# Patient Record
Sex: Female | Born: 1960 | Race: White | Hispanic: No | Marital: Married | Smoking: Current every day smoker
Health system: Southern US, Community
[De-identification: ages and names within clinical notes are randomized; demographics above are authoritative.]

## PROBLEM LIST (undated history)

## (undated) HISTORY — PX: APPENDECTOMY: SHX54

## (undated) HISTORY — PX: VAGINAL HYSTERECTOMY: SUR661

---

## 2007-11-22 ENCOUNTER — Encounter (INDEPENDENT_AMBULATORY_CARE_PROVIDER_SITE_OTHER): Payer: Self-pay | Admitting: Diagnostic Radiology

## 2007-11-22 ENCOUNTER — Encounter: Admission: RE | Admit: 2007-11-22 | Discharge: 2007-11-22 | Payer: Self-pay | Admitting: Internal Medicine

## 2007-12-01 ENCOUNTER — Encounter: Admission: RE | Admit: 2007-12-01 | Discharge: 2007-12-01 | Payer: Self-pay | Admitting: General Surgery

## 2007-12-05 ENCOUNTER — Ambulatory Visit: Payer: Self-pay | Admitting: Oncology

## 2007-12-05 ENCOUNTER — Encounter: Admission: RE | Admit: 2007-12-05 | Discharge: 2007-12-05 | Payer: Self-pay | Admitting: General Surgery

## 2007-12-13 ENCOUNTER — Encounter: Admission: RE | Admit: 2007-12-13 | Discharge: 2007-12-13 | Payer: Self-pay | Admitting: General Surgery

## 2008-02-16 ENCOUNTER — Encounter: Admission: RE | Admit: 2008-02-16 | Discharge: 2008-02-16 | Payer: Self-pay | Admitting: General Surgery

## 2008-02-20 ENCOUNTER — Ambulatory Visit (HOSPITAL_BASED_OUTPATIENT_CLINIC_OR_DEPARTMENT_OTHER): Admission: RE | Admit: 2008-02-20 | Discharge: 2008-02-20 | Payer: Self-pay | Admitting: General Surgery

## 2008-02-20 ENCOUNTER — Encounter (INDEPENDENT_AMBULATORY_CARE_PROVIDER_SITE_OTHER): Payer: Self-pay | Admitting: General Surgery

## 2008-02-20 ENCOUNTER — Encounter: Admission: RE | Admit: 2008-02-20 | Discharge: 2008-02-20 | Payer: Self-pay | Admitting: General Surgery

## 2008-02-20 DIAGNOSIS — C50919 Malignant neoplasm of unspecified site of unspecified female breast: Secondary | ICD-10-CM

## 2008-02-20 HISTORY — DX: Malignant neoplasm of unspecified site of unspecified female breast: C50.919

## 2010-12-23 NOTE — Op Note (Signed)
Angela Fuller, Angela Fuller              ACCOUNT NO.:  [id number]   MEDICAL RECORD NO.:  [id number]          PATIENT TYPE:  AMB   LOCATION:  DSC                          FACILITY:  [facility]   PHYSICIAN:  Angelia Mould. Derrell Lolling, M.D.DATE OF BIRTH:  01-18-1961   DATE OF PROCEDURE:  02/20/2008  DATE OF DISCHARGE:                               OPERATIVE REPORT   PREOPERATIVE DIAGNOSIS:  Invasive ductal carcinoma right breast.   POSTOPERATIVE DIAGNOSIS:  Invasive ductal carcinoma right breast.   OPERATION PERFORMED:  1. Injection blue dye right breast.  2. Right partial mastectomy with needle localization and specimen      mammogram.  3. Right axillary sentinel lymph node mapping and biopsy.   SURGEON:  Angelia Mould. Derrell Lolling, MD   OPERATIVE INDICATIONS:  This is a 50 year old white female who has not  had any breast problems in the past.  Screening mammograms revealed an  abnormality and a diagnostic mammogram and a right breast ultrasound  were performed.  The imaging studies showed two lesions in the right  breast in the upper outer quadrant, an 8 mm and a 5-mm nodule.  Both of  these were biopsied and showed invasive mammary carcinoma which was  receptor positive.  MRI showed the two masses and also showed a small  nodule in the lower outer quadrant of the right breast.  She  subsequently underwent an MRI-guided biopsy of the lesion in the lower  outer quadrant which showed benign process.  She has had genetic  counseling and genetic testing and she was negative for BRCA1 and BRCA2.  She is interested in breast conservation.  She was brought to the  operating room electively.   OPERATIVE TECHNIQUE:  The patient underwent bracketed, double wire  needle localization of the two abnormalities in the upper outer quadrant  of the right breast by Dr. Cain Saupe this morning.  The wires went  very posteriorly and in fact went into the pectoralis muscle.  They did  localize the lesions quite nicely,  however.  The patient was then  brought to [facility].  Nuclear medicine technician  injected radionuclide in the right retroareolar area.  The patient was  taken to the operating room.  The periareolar area of the right breast  was prepped with alcohol.  The patient was identified as the correct  patient, correct procedure and correct site.  We injected 5 mL of blue  dye in the right retroareolar area, 2 mL of methylene blue mixed with 3  mL of saline.  The breast was massaged for 4 minutes.  We then prepped  and draped the right axilla, right chest wall, right breast, and lower  neck in the usual sterile fashion.  Intravenous antibiotics were given  prior to any invasive part of the procedure.   We reviewed the x-rays.  We observed the localizing wires entering the  superior aspect of the right breast and directed from medial to lateral.  We marked a curved incision in the upper outer quadrant overlying where  we felt the two small cancers were.  Marcaine 0.5% with  epinephrine was  used as a local infiltration anesthetic.  The curved incision was made  in the upper outer quadrant of the right breast.  We took the dissection  down into the breast tissue.  We brought the wires into the wound.  We  dissected all the way down to the pectoralis major muscle medially and  then dissected laterally.  We found that the wires went through the  breast tissue into the pectoralis major muscle.  We removed the specimen  and marked it with six separate colors of ink to mark all the margins.  A specimen mammogram was obtained and both Dr. Deboraha Sprang and I felt that  both of the metallic markers had been completely removed.  I was not  sure whether the tumors were close to the posterior margin so I took a  rim of the lateral aspect of the pectoralis major muscle and marked the  new margin with the ink marker.  I discussed this with Dr. Dierdre Searles in  pathology.  She felt that we had completely removed  the cancers and  probably had a negative margin in the primary specimen though she was  going to test the muscle margin as well.  This wound was irrigated with  saline.  Hemostasis was excellent.  We were able to mobilize the deeper  breast tissue somewhat and close the deeper layers with five interrupted  sutures of 3-0 Vicryl.  The skin was closed with a running subcuticular  suture of 4-0 Monocryl.   Using the NeoProbe, I identified the area of greatest radioactivity in  the right axilla.  A curved skin crease incision was made in the right  axilla generally close to the hairline.  Dissection was carried down  through the subcutaneous tissue.  We used the NeoProbe to direct our  dissection.  We incised the clavipectoral fascia.  We entered the axilla  and actually found two blue lymphatics, traced them down to a single  very hot and very blue sentinel node.  This was excised.  It had about  100 times background activity.  Dr. Dierdre Searles in pathology did imprint cytology  on the single sentinel node and she said it was negative for cancer  cells.  We did not find any other sentinel nodes visually or by  NeoProbe.  The right axillary wound was irrigated with saline.  Hemostasis was excellent and achieved with electrocautery.  The deeper  tissues were closed with interrupted sutures of 3-0 Vicryl and the skin  closed with a running subcuticular suture of 4-0 Monocryl.  Both  incisions were also covered with Dermabond and then a light gauze  bandage and an ice pack.  The patient tolerated the procedure well and  was taken to the recovery room in stable condition.  Estimated blood  loss was about 20 mL or less.  Complications none.  Sponge, needles and  instrument counts were correct.      Angelia Mould. Derrell Lolling, M.D.  Electronically Signed     HMI/MEDQ  D:  02/20/2008  T:  02/20/2008  Job:  [id number]   cc:   Marlow Baars, MD  Dellia Beckwith, M.D.

## 2011-05-07 LAB — COMPREHENSIVE METABOLIC PANEL
Alkaline Phosphatase: 47
BUN: 7
Calcium: 9.2
Glucose, Bld: 91
Total Protein: 7.1

## 2011-05-07 LAB — CBC
HCT: 43.8
Hemoglobin: 15
MCHC: 34.3
MCV: 84.3
RDW: 13.3

## 2011-05-07 LAB — DIFFERENTIAL
Basophils Relative: 0
Lymphs Abs: 2
Monocytes Relative: 6
Neutro Abs: 5.2
Neutrophils Relative %: 65

## 2011-05-07 LAB — URINALYSIS, ROUTINE W REFLEX MICROSCOPIC
Bilirubin Urine: NEGATIVE
Hgb urine dipstick: NEGATIVE
Ketones, ur: NEGATIVE
Nitrite: NEGATIVE
pH: 6

## 2011-05-07 LAB — LACTATE DEHYDROGENASE, ISOENZYMES
LDH 1: 26 % (ref 14–27)
LDH 2: 36 % (ref 29–42)
LDH Isoenzymes, Total: 138 U/L (ref 105–230)

## 2011-07-12 ENCOUNTER — Inpatient Hospital Stay: Payer: Self-pay | Admitting: Internal Medicine

## 2012-01-21 DIAGNOSIS — Z8659 Personal history of other mental and behavioral disorders: Secondary | ICD-10-CM

## 2012-01-21 DIAGNOSIS — F32A Depression, unspecified: Secondary | ICD-10-CM

## 2012-01-21 DIAGNOSIS — IMO0002 Reserved for concepts with insufficient information to code with codable children: Secondary | ICD-10-CM

## 2012-01-21 DIAGNOSIS — J45909 Unspecified asthma, uncomplicated: Secondary | ICD-10-CM

## 2012-01-21 DIAGNOSIS — J4520 Mild intermittent asthma, uncomplicated: Secondary | ICD-10-CM | POA: Insufficient documentation

## 2012-01-21 DIAGNOSIS — F419 Anxiety disorder, unspecified: Secondary | ICD-10-CM

## 2012-01-21 HISTORY — DX: Mild intermittent asthma, uncomplicated: J45.20

## 2012-01-21 HISTORY — DX: Personal history of other mental and behavioral disorders: Z86.59

## 2012-01-21 HISTORY — DX: Anxiety disorder, unspecified: F41.9

## 2012-01-21 HISTORY — DX: Reserved for concepts with insufficient information to code with codable children: IMO0002

## 2012-01-21 HISTORY — DX: Unspecified asthma, uncomplicated: J45.909

## 2012-01-21 HISTORY — DX: Depression, unspecified: F32.A

## 2012-05-09 DIAGNOSIS — M7061 Trochanteric bursitis, right hip: Secondary | ICD-10-CM

## 2012-05-09 HISTORY — DX: Trochanteric bursitis, right hip: M70.61

## 2014-10-29 ENCOUNTER — Ambulatory Visit: Payer: Self-pay | Admitting: Family Medicine

## 2015-06-20 DIAGNOSIS — J069 Acute upper respiratory infection, unspecified: Secondary | ICD-10-CM | POA: Insufficient documentation

## 2015-06-20 DIAGNOSIS — J40 Bronchitis, not specified as acute or chronic: Secondary | ICD-10-CM | POA: Insufficient documentation

## 2015-06-20 HISTORY — DX: Acute upper respiratory infection, unspecified: J06.9

## 2015-06-20 HISTORY — DX: Bronchitis, not specified as acute or chronic: J40

## 2015-06-22 IMAGING — CR DG LUMBAR SPINE 2-3V
1 series · 3 of 3 positions shown · non-contrast
Comparison: None

CLINICAL DATA: Lymphedema RIGHT hip, chronic RIGHT hip and lumbar
pain, RIGHT-side nerve damage, RIGHT leg numbness and weakness for 4
years, history breast cancer, ovarian cancer, bulging discs

EXAM:
LUMBAR SPINE - 2-3 VIEW

[Series 1: dxr lumbar spine ap and lateral · 0.14mm/px · 3 of 3 slices shown]
[im 1/3]
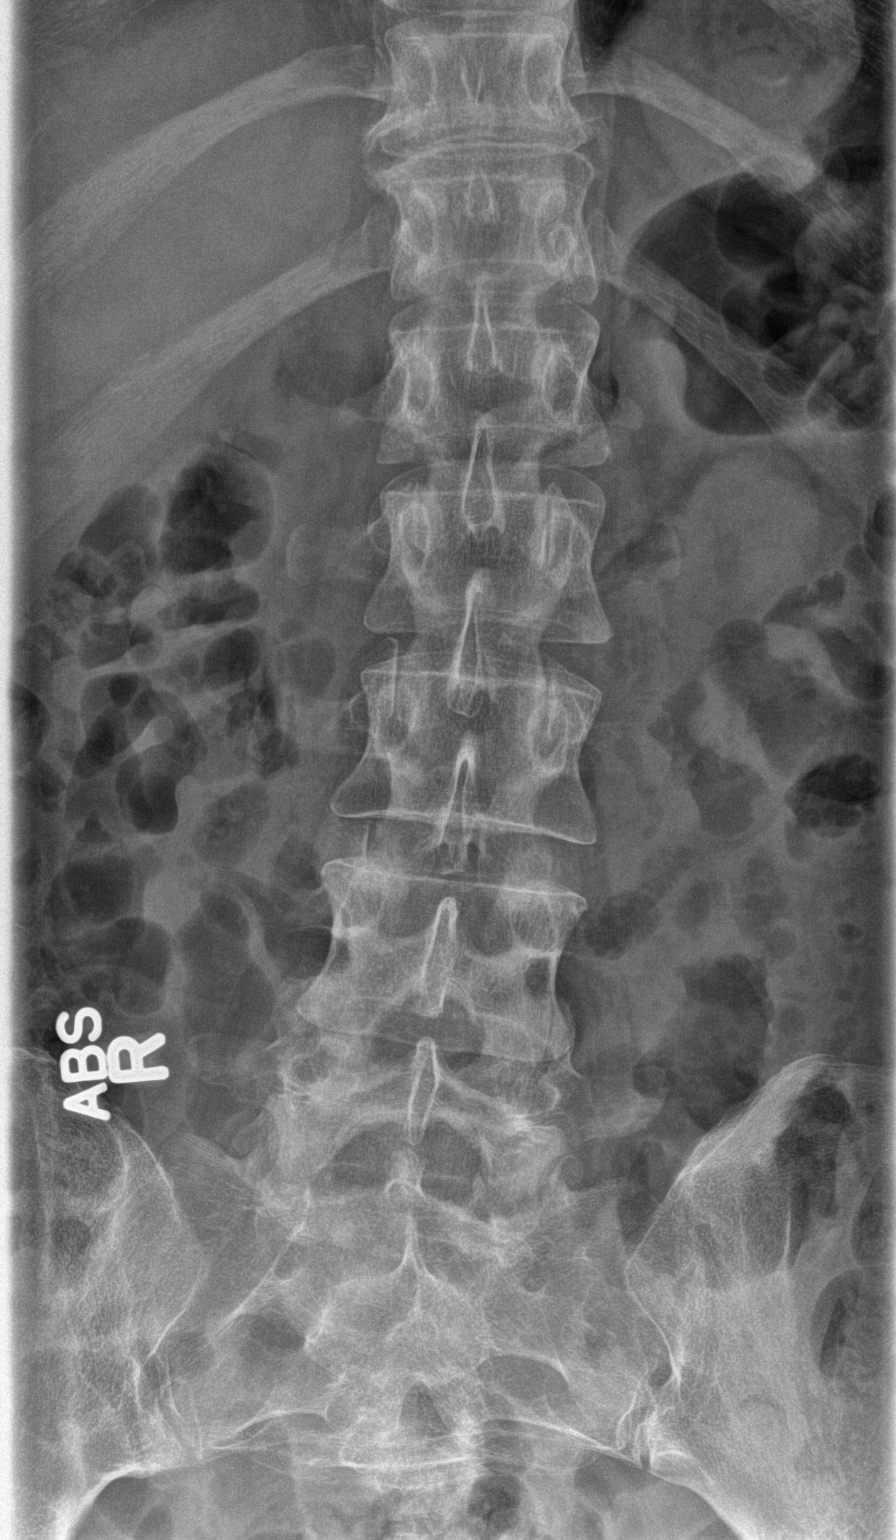
[im 2/3]
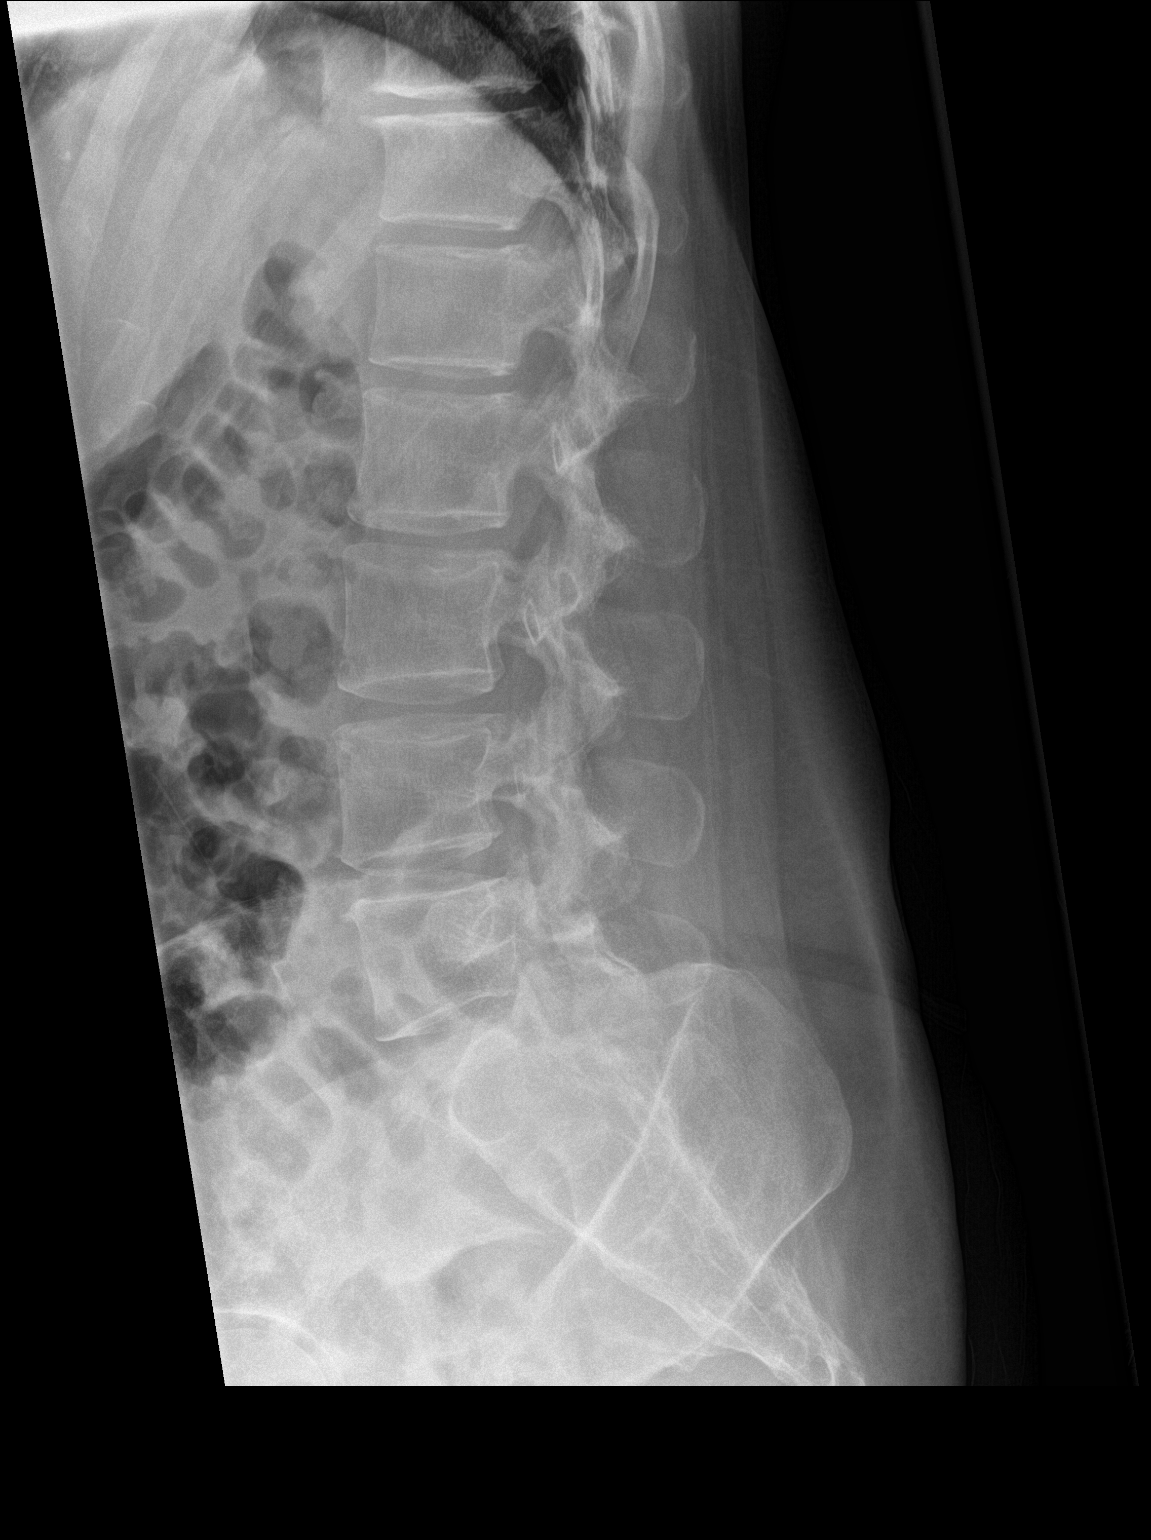
[im 3/3]
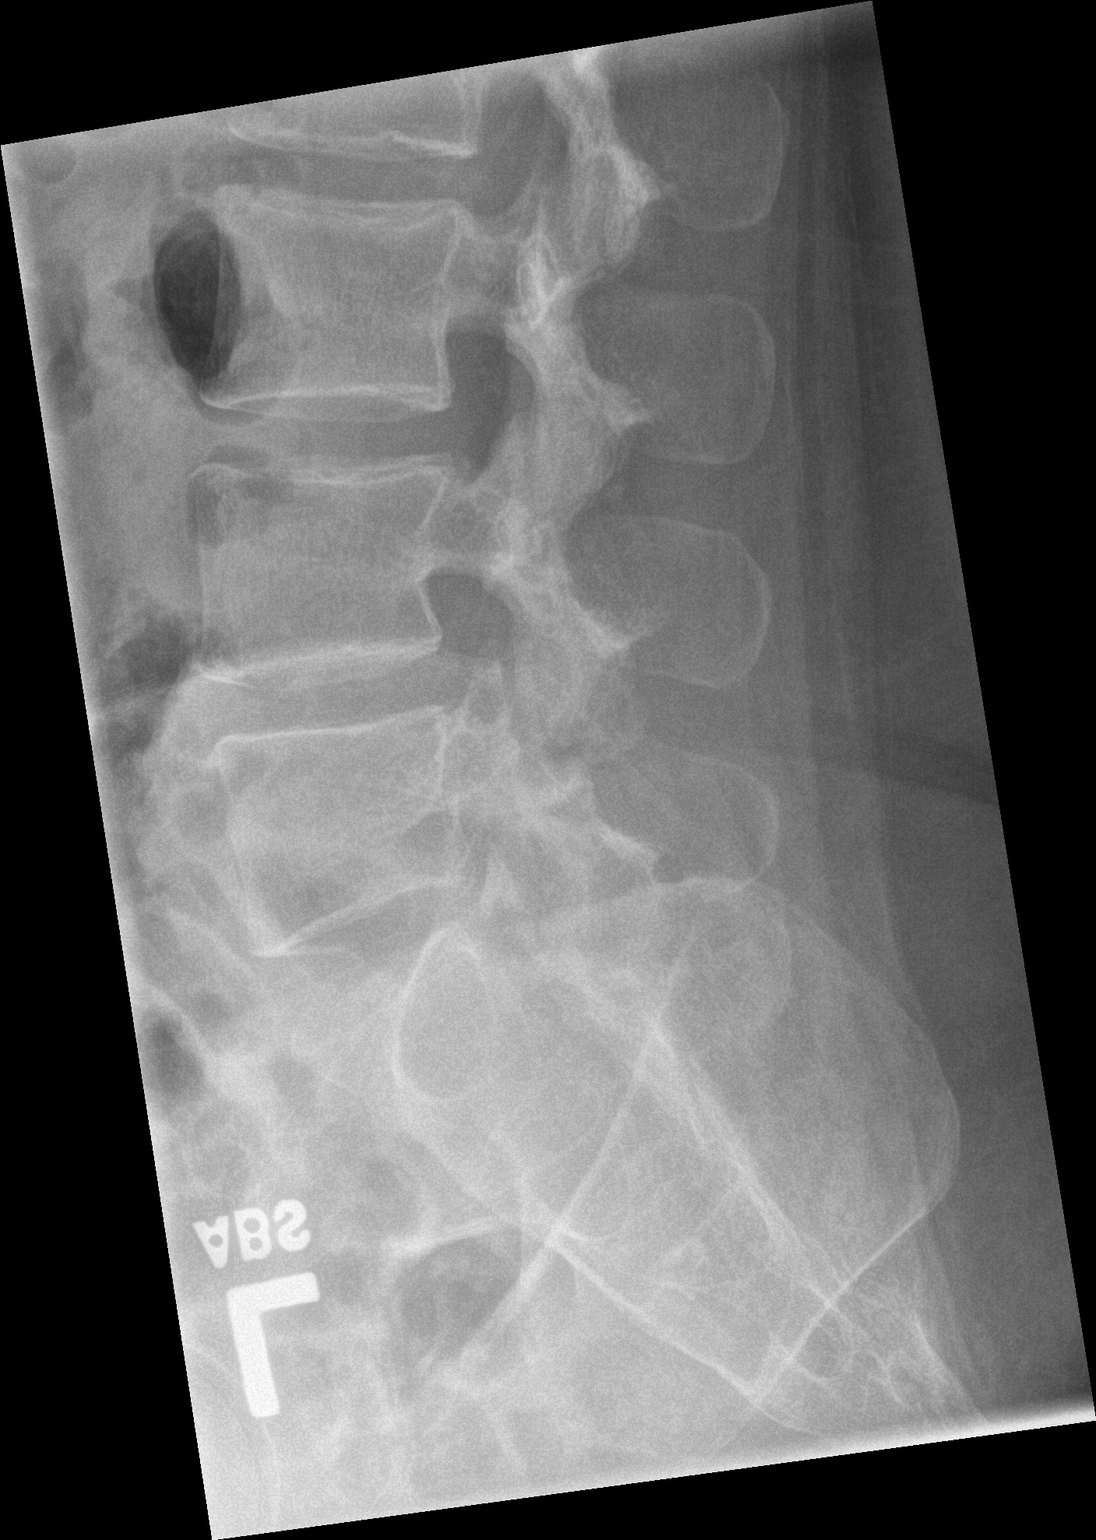

[3 of 3 positions shown; findings below may reference images not displayed]

FINDINGS: Five non-rib-bearing lumbar vertebrae.

Mild osseous demineralization.

Mild broad-based levoconvex thoracolumbar scoliosis.

Vertebral body heights maintained.

Disc space narrowing with tiny endplate spurs at T11-T12.

No acute fracture or subluxation.

No definite sclerotic or lytic lesions identified to suggest osseous
metastatic disease.

SI joints preserved.
IMPRESSION: Mild broad-based levoconvex thoracolumbar scoliosis.

Degenerative disc disease changes T11-T12.

No acute lumbar spine abnormalities.

## 2017-04-10 ENCOUNTER — Encounter: Payer: Self-pay | Admitting: Emergency Medicine

## 2017-04-10 ENCOUNTER — Emergency Department
Admission: EM | Admit: 2017-04-10 | Discharge: 2017-04-10 | Disposition: A | Discharge status: Home / Self Care | Payer: Self-pay | Attending: Student in an Organized Health Care Education/Training Program | Admitting: Student in an Organized Health Care Education/Training Program

## 2017-04-10 DIAGNOSIS — L259 Unspecified contact dermatitis, unspecified cause: Secondary | ICD-10-CM | POA: Insufficient documentation

## 2017-04-10 MED ORDER — CEPHALEXIN 500 MG PO CAPS: 500.0000 mg | ORAL_CAPSULE | Freq: Three times a day (TID) | ORAL | 0 refills | Status: AC

## 2017-04-10 MED ORDER — DEXAMETHASONE SODIUM PHOSPHATE 10 MG/ML IJ SOLN
10.0000 mg | Freq: Once | INTRAMUSCULAR | Status: AC
  Administered 2017-04-10: 10 mg via INTRAMUSCULAR
  Filled 2017-04-10: qty 1

## 2017-04-10 MED ORDER — CEPHALEXIN 500 MG PO CAPS
500.0000 mg | ORAL_CAPSULE | Freq: Once | ORAL | Status: AC
  Administered 2017-04-10: 500 mg via ORAL
  Filled 2017-04-10: qty 1

## 2017-04-10 MED ORDER — PREDNISONE 10 MG (21) PO TBPK: ORAL_TABLET | ORAL | 0 refills | Status: DC

## 2017-04-10 NOTE — ED Triage Notes (Signed)
Pt ambulatory to triage with no difficulty. Pt repots swelling under her left eye that started today and then noticed a few small "bumps" by her right eye this afternoon. Took benadryl 50 mg  around  2 pm. But did not help.

## 2017-04-10 NOTE — ED Provider Notes (Signed)
[facility] Emergency Department Provider Note   ____________________________________________   I have reviewed the triage vital signs and the nursing notes.   HISTORY  Chief Complaint Facial Swelling    HPI Angela Fuller is a 56 y.o. female presents to the emergency department with bilateral eye erythema with swelling along the inferior aspect of the left eye. Patient reports development of symptoms earlier today while at work. Patient reported initially mild eye irritation with grittiness that resolved. Patient then noted swelling along the inferior aspect of the left eye with redness along bilateral eyes. Patient denies any decreased visual acuity, blurriness or drainage. Patient only reports recently changing shampoo and is unsure if this is a likely trigger. Otherwise, she denies any other known trigger. Patient denies any known environmental or drug allergies. 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 She is unsure if she may have touched a door handle or something else on the car and touched her eye causing current symptoms. Patient denies fever, chills, headache, vision changes, chest pain, chest tightness, shortness of breath, abdominal pain, nausea and vomiting.  No past medical history on file.  There are no active problems to display for this patient.   No past surgical history on file.  Prior to Admission medications   Medication Sig Start Date End Date Taking? Authorizing Provider  cephALEXin (KEFLEX) 500 MG capsule Take 1 capsule (500 mg total) by mouth 3 (three) times daily. 04/10/17 04/20/17  Maximino Cozzolino M, PA-C  predniSONE (STERAPRED UNI-PAK 21 TAB) 10 MG (21) TBPK tablet Take 6 tablets on day 1. Take 5 tablets on day 2. Take 4 tablets on day 3. Take 3 tablets on day 4. Take 2 tablets on day 5. Take 1 tablets on day 6. 04/10/17   Linzy Darling M, PA-C    Allergies Contrast media [iodinated diagnostic agents]  No family history on  file.  Social History Social History  Substance Use Topics  . Smoking status: Not on file  . Smokeless tobacco: Not on file  . Alcohol use Not on file    Review of Systems Constitutional: Negative for fever/chills Eyes: No visual changes. Left eye pain with erythema along the inferior aspect of the left eye. Erythema noted at right eye. Negative drainage from bilateral eyes. ENT:  Negative for sore throat and for difficulty swallowing Cardiovascular: Denies chest pain. Respiratory: Denies cough. Denies shortness of breath. Gastrointestinal: No abdominal pain.  No nausea, vomiting, diarrhea. Genitourinary: Negative for dysuria. Musculoskeletal: Negative for back pain. Skin: Negative for rash. Left eye pain with erythema along the inferior aspect of the left eye. Erythema noted at right eye.  Neurological: Negative for headaches.  Negative focal weakness or numbness. Negative for loss of consciousness. Able to ambulate. ____________________________________________   PHYSICAL EXAM:  VITAL SIGNS: ED Triage Vitals  Enc Vitals Group     BP 04/10/17 2209 (!) 146/81     Pulse Rate 04/10/17 2209 77     Resp 04/10/17 2209 18     Temp 04/10/17 2209 98.2 F (36.8 C)     Temp src --      SpO2 04/10/17 2209 97 %     Weight 04/10/17 2210 103 lb 8 oz (46.9 kg)     Height 04/10/17 2210 5\' 3"  (1.6 m)     Head Circumference --      Peak Flow --      Pain Score 04/10/17 2208 4     Pain Loc --  Pain Edu? --      Excl. in [place]? --     Constitutional: Alert and oriented. Well appearing and in no acute distress.  Eyes: Conjunctivae are normal. PERRL. EOMI. Left eye pain with erythema along the inferior aspect of the left eye. Erythema noted at right eye. No drainage noted. Head: Normocephalic and atraumatic. ENT:      Ears: Canals clear. TMs intact bilaterally.      Nose: No congestion/rhinnorhea.      Mouth/Throat: Mucous membranes are moist.  Neck:Supple. No thyromegaly. No stridor.    Cardiovascular: Normal rate, regular rhythm. Normal S1 and S2.  Good peripheral circulation. Respiratory: Normal respiratory effort without tachypnea or retractions. Lungs CTAB. No wheezes/rales/rhonchi. Good air entry to the bases with no decreased or absent breath sounds. Hematological/Lymphatic/Immunological: No cervical lymphadenopathy. Cardiovascular: Normal rate, regular rhythm. Normal distal pulses. Gastrointestinal: Bowel sounds 4 quadrants. Soft and nontender to palpation.  Musculoskeletal: Nontender with normal range of motion in all extremities. Neurologic: Normal speech and language.  Skin:  Skin is warm, dry and intact. No rash noted. Psychiatric: Mood and affect are normal. Speech and behavior are normal. Patient exhibits appropriate insight and judgement.  ____________________________________________   LABS (all labs ordered are listed, but only abnormal results are displayed)  Labs Reviewed - No data to display ____________________________________________  EKG none ____________________________________________  RADIOLOGY none ____________________________________________   PROCEDURES  Procedure(s) performed: no    Critical Care performed: no ____________________________________________   INITIAL IMPRESSION / ASSESSMENT AND PLAN / ED COURSE  Pertinent labs & imaging results that were available during my care of the patient were reviewed by me and considered in my medical decision making (see chart for details).   Patient presents to emergency department with erythema and mild swelling along the inferior aspect of the lower eyelids without eye involvement that developed earlier today. History and physical exam findings are reassuring symptoms are consistent with contact dermatitis secondary to an unknown trigger. Patient verbalized significant concern for bacterial infection considering her work environment and the risk of bacterial infection. Patient will be  prescribed prednisone taper and advised to take Benadryl as needed for itching. Patient will also be given Keflex for empiric antibiotic coverage. Patient advised to follow up with PCP as needed or return to the emergency department if symptoms return or worsen. ____________________________________________   FINAL CLINICAL IMPRESSION(S) / ED DIAGNOSES  Final diagnoses:  Contact dermatitis, unspecified contact dermatitis type, unspecified trigger       NEW MEDICATIONS STARTED DURING THIS VISIT:  Discharge Medication List as of 04/10/2017 11:07 PM    START taking these medications   Details  cephALEXin (KEFLEX) 500 MG capsule Take 1 capsule (500 mg total) by mouth 3 (three) times daily., Starting Sat 04/10/2017, Until Tue 04/20/2017, Print    predniSONE (STERAPRED UNI-PAK 21 TAB) 10 MG (21) TBPK tablet Take 6 tablets on day 1. Take 5 tablets on day 2. Take 4 tablets on day 3. Take 3 tablets on day 4. Take 2 tablets on day 5. Take 1 tablets on day 6., Print         Note:  This document was prepared using Dragon voice recognition software and may include unintentional dictation errors.    Jerolyn Shin, PA-C 04/10/17 2347    Merlyn Lot, MD 04/11/17 [phone]

## 2017-04-10 NOTE — Discharge Instructions (Signed)
Take medication as prescribed. Return to emergency department if symptoms worsen and follow-up with PCP as needed.   °

## 2019-09-15 DIAGNOSIS — J309 Allergic rhinitis, unspecified: Secondary | ICD-10-CM | POA: Insufficient documentation

## 2019-09-15 HISTORY — DX: Allergic rhinitis, unspecified: J30.9

## 2019-11-30 DIAGNOSIS — R5381 Other malaise: Secondary | ICD-10-CM | POA: Insufficient documentation

## 2019-11-30 DIAGNOSIS — Z853 Personal history of malignant neoplasm of breast: Secondary | ICD-10-CM

## 2019-11-30 DIAGNOSIS — K580 Irritable bowel syndrome with diarrhea: Secondary | ICD-10-CM | POA: Insufficient documentation

## 2019-11-30 HISTORY — DX: Personal history of malignant neoplasm of breast: Z85.3

## 2019-11-30 HISTORY — DX: Irritable bowel syndrome with diarrhea: K58.0

## 2019-11-30 HISTORY — DX: Other malaise: R53.81

## 2019-12-26 DIAGNOSIS — K5732 Diverticulitis of large intestine without perforation or abscess without bleeding: Secondary | ICD-10-CM | POA: Insufficient documentation

## 2019-12-26 HISTORY — DX: Diverticulitis of large intestine without perforation or abscess without bleeding: K57.32

## 2020-06-03 DIAGNOSIS — E782 Mixed hyperlipidemia: Secondary | ICD-10-CM | POA: Insufficient documentation

## 2020-06-03 HISTORY — DX: Mixed hyperlipidemia: E78.2

## 2020-06-25 DIAGNOSIS — Z1211 Encounter for screening for malignant neoplasm of colon: Secondary | ICD-10-CM

## 2020-06-25 DIAGNOSIS — Z1159 Encounter for screening for other viral diseases: Secondary | ICD-10-CM | POA: Insufficient documentation

## 2020-06-25 HISTORY — DX: Encounter for screening for other viral diseases: Z11.59

## 2020-06-25 HISTORY — DX: Encounter for screening for malignant neoplasm of colon: Z12.11

## 2021-02-03 DIAGNOSIS — E538 Deficiency of other specified B group vitamins: Secondary | ICD-10-CM

## 2021-02-03 HISTORY — DX: Deficiency of other specified B group vitamins: E53.8

## 2021-04-24 DIAGNOSIS — I83813 Varicose veins of bilateral lower extremities with pain: Secondary | ICD-10-CM | POA: Insufficient documentation

## 2021-04-24 HISTORY — DX: Varicose veins of bilateral lower extremities with pain: I83.813

## 2021-06-03 DIAGNOSIS — K2100 Gastro-esophageal reflux disease with esophagitis, without bleeding: Secondary | ICD-10-CM

## 2021-06-03 HISTORY — DX: Gastro-esophageal reflux disease with esophagitis, without bleeding: K21.00

## 2021-10-02 DIAGNOSIS — I872 Venous insufficiency (chronic) (peripheral): Secondary | ICD-10-CM

## 2021-10-02 HISTORY — DX: Venous insufficiency (chronic) (peripheral): I87.2

## 2021-11-06 DIAGNOSIS — R079 Chest pain, unspecified: Secondary | ICD-10-CM

## 2021-11-06 HISTORY — DX: Chest pain, unspecified: R07.9

## 2021-12-02 DIAGNOSIS — Z87891 Personal history of nicotine dependence: Secondary | ICD-10-CM | POA: Insufficient documentation

## 2021-12-02 HISTORY — DX: Personal history of nicotine dependence: Z87.891

## 2022-02-05 ENCOUNTER — Ambulatory Visit: Payer: BLUE CROSS/BLUE SHIELD | Admitting: Cardiology

## 2022-02-05 ENCOUNTER — Encounter: Payer: Self-pay | Admitting: Cardiology

## 2022-02-05 VITALS — BP 122/80 | HR 115 | Ht 64.0 in | Wt 143.4 lb

## 2022-02-05 DIAGNOSIS — E782 Mixed hyperlipidemia: Secondary | ICD-10-CM | POA: Diagnosis not present

## 2022-02-05 DIAGNOSIS — R0609 Other forms of dyspnea: Secondary | ICD-10-CM

## 2022-02-05 DIAGNOSIS — F17210 Nicotine dependence, cigarettes, uncomplicated: Secondary | ICD-10-CM

## 2022-02-05 DIAGNOSIS — I700 Atherosclerosis of aorta: Secondary | ICD-10-CM

## 2022-02-05 DIAGNOSIS — I429 Cardiomyopathy, unspecified: Secondary | ICD-10-CM

## 2022-02-05 HISTORY — DX: Cardiomyopathy, unspecified: I42.9

## 2022-02-05 HISTORY — DX: Nicotine dependence, cigarettes, uncomplicated: F17.210

## 2022-02-05 HISTORY — DX: Atherosclerosis of aorta: I70.0

## 2022-02-05 MED ORDER — PREDNISONE 50 MG PO TABS: ORAL_TABLET | ORAL | 0 refills | Status: DC

## 2022-02-05 NOTE — Progress Notes (Signed)
Cardiology Office Note:    Date:  02/05/2022   ID:  Angela Fuller, DOB [birth year], MRN [id number]  PCP:  Gweneth Fritter, FNP  Cardiologist:  Jenean Lindau, MD   Referring MD: Gweneth Fritter, FNP    ASSESSMENT:    1. Mixed hyperlipidemia   2. Aortic atherosclerosis ([place])   3. Cigarette smoker   4. DOE (dyspnea on exertion)   5. Cardiomyopathy, unspecified type ([place])    PLAN:    In order of problems listed above:  Primary prevention stressed with the patient.  Importance of compliance with diet medication stressed and she vocalized understanding. Dyspnea on exertion and newly diagnosed cardiomyopathy: I discussed this with the patient at length.  I think she needs to be evaluated more extensively.  Also she has marked hyperlipidemia and refuses statins at this time.  I will do a CT coronary angiography for further detailed evaluation of coronary artery status.  She is agreeable.  Hopefully that will help [place] give her more evaluation of the reason why she may have cardiomyopathy.  Also wanted to start guideline directed medical therapy such as Entresto and Jardiance like medications but she is not keen on it.  I respect her wishes.  Her blood pressure is also borderline. Mixed dyslipidemia: Markedly elevated LDL.  She has tried some supplements which I cautioned her we will not be sufficing for this however I told her that I am willing to give her time and she will be seen in follow-up appointment in 6 weeks at which point she will have a liver lipid check before seeing and then we will further discuss lipid-lowering medications like statins.  She is agreeable on this approach. Cigarette smoker: I spent 5 minutes with the patient discussing solely about smoking. Smoking cessation was counseled. I suggested to the patient also different medications and pharmacological interventions. Patient is keen to try stopping on its own at this time. He will get back to me if he needs any further  assistance in this matter. Patient will be seen in follow-up appointment in 6 months or earlier if the patient has any concerns    Medication Adjustments/Labs and Tests Ordered: Current medicines are reviewed at length with the patient today.  Concerns regarding medicines are outlined above.  Orders Placed This Encounter  Procedures   CT CORONARY MORPH W/CTA COR W/SCORE W/CA W/CM &/OR WO/CM   Basic metabolic panel   Hepatic function panel   Lipid panel   EKG 12-Lead   Meds ordered this encounter  Medications   predniSONE (DELTASONE) 50 MG tablet    Sig: Prednisone 50 mg - take 13 hours prior to test Take another Prednisone 50 mg 7 hours prior to test Take another Prednisone 50 mg 1 hour prior to test Take Benadryl 50 mg 1 hour prior to test Patient must complete all four doses of above prophylactic medications. Patient will need a ride after test due to Benadryl.    Dispense:  3 tablet    Refill:  0     History of Present Illness:    Angela Fuller is a 61 y.o. female who is being seen today for the evaluation of cardiomyopathy with reduced ejection fraction calculated to be 40%.  At the request of Gweneth Fritter, FNP.  Patient is a pleasant 61 year old female.  She has past medical history of mixed hyperlipidemia.  She is a smoker.  She was evaluated for some dyspnea issues and therefore underwent testing.  Echocardiogram revealed ejection fraction 40%.  She is here for second opinion.  She leads a sedentary lifestyle.  She has dyspnea on exertion.  She denies any chest pain orthopnea or PND.  At the time of my evaluation, the patient is alert awake oriented and in no distress.  Past Medical History:  Diagnosis Date   Allergic rhinitis 09/15/2019   Anxiety disorder 01/21/2012   Asthma 01/21/2012   Bronchitis 06/20/2015   Depressive disorder 01/21/2012   Diverticulitis of large intestine without perforation or abscess without bleeding 12/26/2019   Encounter for screening colonoscopy  06/25/2020   History of breast cancer 11/30/2019   Irritable bowel syndrome with diarrhea 11/30/2019   Malaise and fatigue 11/30/2019   Malignant neoplasm of breast (female) ([place]) 02/20/2008   Formatting of this note might be different from the original. 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  The lesion was found to; be estrogen and progesterone positive and HER-2/neu negative.  The; patient is status post radiation therapy and tamoxifen therapy was; initiat   Mild intermittent asthma without complication 1/69/6789   Mixed hyperlipidemia 06/03/2020   Personal history of other mental and behavioral disorders 01/21/2012   Screening examination for other arthropod-borne viral diseases 06/25/2020   Thoracic or lumbosacral neuritis or radiculitis 01/21/2012   Trochanteric bursitis of right hip 05/09/2012   Viral upper respiratory tract infection 06/20/2015    Past Surgical History:  Procedure Laterality Date   APPENDECTOMY     VAGINAL HYSTERECTOMY      Current Medications: Current Meds  Medication Sig   Coenzyme Q10 (COQ10) 100 MG CAPS Take 100 mg by mouth daily.   metoprolol tartrate (LOPRESSOR) 25 MG tablet Take 12.5 mg by mouth 2 (two) times daily.   predniSONE (DELTASONE) 50 MG tablet Prednisone 50 mg - take 13 hours prior to test Take another Prednisone 50 mg 7 hours prior to test Take another Prednisone 50 mg 1 hour prior to test Take Benadryl 50 mg 1 hour prior to test Patient must complete all four doses of above prophylactic medications. Patient will need a ride after test due to Benadryl.   VITAMIN D-VITAMIN K PO Take 1 tablet by mouth daily.     Allergies:   Contrast media [iodinated contrast media]   Social History   Socioeconomic History   Marital status: Married    Spouse name: Not on file   Number of children: Not on file   Years of education: Not  on file   Highest education level: Not on file  Occupational History   Not on file  Tobacco Use   Smoking status: Every Day    Types: Cigarettes   Smokeless tobacco: Never  Substance and Sexual Activity   Alcohol use: Not on file   Drug use: Not on file   Sexual activity: Not on file  Other Topics Concern   Not on file  Social History Narrative   Not on file   Social Determinants of Health   Financial Resource Strain: Not on file  Food Insecurity: Not on file  Transportation Needs: Not on file  Physical Activity: Not on file  Stress: Not on file  Social Connections: Not on file     Family History: The patient's family history includes Cancer in her sister and sister; Diabetes in her sister; Heart disease in her sister and sister; Hypertension in her mother.  ROS:  Please see the history of present illness.    All other systems reviewed and are negative.  EKGs/Labs/Other Studies Reviewed:    The following studies were reviewed today: EKG reveals sinus rhythm and nonspecific ST-T changes   Recent Labs: No results found for requested labs within last 365 days.  Recent Lipid Panel No results found for: "CHOL", "TRIG", "HDL", "CHOLHDL", "VLDL", "LDLCALC", "LDLDIRECT"  Physical Exam:    VS:  BP 122/80   Pulse (!) 115   Ht 5' 4"  (1.626 m)   Wt 143 lb 6.4 oz (65 kg)   SpO2 97%   BMI 24.61 kg/m     Wt Readings from Last 3 Encounters:  02/05/22 143 lb 6.4 oz (65 kg)  04/10/17 103 lb 8 oz (46.9 kg)     GEN: Patient is in no acute distress HEENT: Normal NECK: No JVD; No carotid bruits LYMPHATICS: No lymphadenopathy CARDIAC: S1 S2 regular, 2/6 systolic murmur at the apex. RESPIRATORY:  Clear to auscultation without rales, wheezing or rhonchi  ABDOMEN: Soft, non-tender, non-distended MUSCULOSKELETAL:  No edema; No deformity  SKIN: Warm and dry NEUROLOGIC:  Alert and oriented x 3 PSYCHIATRIC:  Normal affect    Signed, Jenean Lindau, MD  02/05/2022 2:25  PM    [facility]

## 2022-02-05 NOTE — Patient Instructions (Addendum)
Medication Instructions:  Your physician has recommended you make the following change in your medication:    *If you need a refill on your cardiac medications before your next appointment, please call your pharmacy*   Lab Work: Your physician recommends that you return for lab work in: before your next visit You need to have labs done when you are fasting.  You can come Monday through Friday 8:30 am to 12:00 pm and 1:15 to 4:30. You do not need to make an appointment as the order has already been placed. The labs you are going to have done are BMET, LFT and Lipids.   If you have labs (blood work) drawn today and your tests are completely normal, you will receive your results only by: [place] (if you have MyChart) OR A paper copy in the mail If you have any lab test that is abnormal or we need to change your treatment, we will call you to review the results.   Testing/Procedures:   Your cardiac CT will be scheduled at one of the below locations:   [facility] [address] [place], [place] [address] [phone]   At [facility], please arrive at the [facility] and Children's Entrance (Entrance C2) of [facility] 30 minutes prior to test start time. You can use the FREE valet parking offered at entrance C (encouraged to control the heart rate for the test)  Proceed to the [facility] Radiology Department (first floor) to check-in and test prep.  All radiology patients and guests should use entrance C2 at [facility], accessed from [facility], even though the hospital's physical address listed is [address].      Please follow these instructions carefully (unless otherwise directed):   On the Night Before the Test: Be sure to Drink plenty of water. Do not consume any caffeinated/decaffeinated beverages or chocolate 12 hours prior to your test. Do not take any antihistamines 12 hours prior to your test. If  the patient has contrast allergy: Patient will need a prescription for Prednisone and very clear instructions (as follows): Prednisone 50 mg - take 13 hours prior to test Take another Prednisone 50 mg 7 hours prior to test Take another Prednisone 50 mg 1 hour prior to test Take Benadryl 50 mg 1 hour prior to test Patient must complete all four doses of above prophylactic medications. Patient will need a ride after test due to Benadryl.  On the Day of the Test: Drink plenty of water until 1 hour prior to the test. Do not eat any food 4 hours prior to the test. You may take your regular medications prior to the test.  Take 2 tablets of your metoprolol (Lopressor) two hours prior to test. FEMALES- please wear underwire-free bra if available, avoid dresses & tight clothing      After the Test: Drink plenty of water. After receiving IV contrast, you may experience a mild flushed feeling. This is normal. On occasion, you may experience a mild rash up to 24 hours after the test. This is not dangerous. If this occurs, you can take Benadryl 25 mg and increase your fluid intake. If you experience trouble breathing, this can be serious. If it is severe call 911 IMMEDIATELY. If it is mild, please call our office.  We will call to schedule your test 2-4 weeks out understanding that some insurance companies will need an authorization prior to the service being performed.   For non-scheduling  related questions, please contact the cardiac imaging nurse navigator should you have any questions/concerns: Marchia Bond, Cardiac Imaging Nurse Navigator Gordy Clement, Cardiac Imaging Nurse Navigator [facility] Direct Office Dial: [phone]   For scheduling needs, including cancellations and rescheduling, please call [place], [phone].    Follow-Up: At [facility], you and your health needs are our priority.  As part of our continuing mission to provide you with  exceptional heart care, we have created designated Provider Care Teams.  These Care Teams include your primary Cardiologist (physician) and Advanced Practice Providers (APPs -  Physician Assistants and Nurse Practitioners) who all work together to provide you with the care you need, when you need it.  We recommend signing up for the patient portal called "MyChart".  Sign up information is provided on this After Visit Summary.  MyChart is used to connect with patients for Virtual Visits (Telemedicine).  Patients are able to view lab/test results, encounter notes, upcoming appointments, etc.  Non-urgent messages can be sent to your provider as well.   To learn more about what you can do with MyChart, go to [email or web address].    Your next appointment:   6 weeks  The format for your next appointment:   In Person  Provider:   Jyl Heinz, MD   Other Instructions Cardiac CT Angiogram A cardiac CT angiogram is a procedure to look at the heart and the area around the heart. It may be done to help find the cause of chest pains or other symptoms of heart disease. During this procedure, a substance called contrast dye is injected into the blood vessels in the area to be checked. A large X-ray machine, called a CT scanner, then takes detailed pictures of the heart and the surrounding area. The procedure is also sometimes called a coronary CT angiogram, coronary artery scanning, or CTA. A cardiac CT angiogram allows the health care provider to see how well blood is flowing to and from the heart. The health care provider will be able to see if there are any problems, such as: Blockage or narrowing of the coronary arteries in the heart. Fluid around the heart. Signs of weakness or disease in the muscles, valves, and tissues of the heart. Tell a health care provider about: Any allergies you have. This is especially important if you have had a previous allergic reaction to contrast dye. All medicines  you are taking, including vitamins, herbs, eye drops, creams, and over-the-counter medicines. Any blood disorders you have. Any surgeries you have had. Any medical conditions you have. Whether you are pregnant or may be pregnant. Any anxiety disorders, chronic pain, or other conditions you have that may increase your stress or prevent you from lying still. What are the risks? Generally, this is a safe procedure. However, problems may occur, including: Bleeding. Infection. Allergic reactions to medicines or dyes. Damage to other structures or organs. Kidney damage from the contrast dye that is used. Increased risk of cancer from radiation exposure. This risk is low. Talk with your health care provider about: The risks and benefits of testing. How you can receive the lowest dose of radiation. What happens before the procedure? Wear comfortable clothing and remove any jewelry, glasses, dentures, and hearing aids. Follow instructions from your health care provider about eating and drinking. This may include: For 12 hours before the procedure -- avoid caffeine. This includes tea, coffee, soda, energy drinks, and diet pills. Drink plenty of water or other fluids that do  not have caffeine in them. Being well hydrated can prevent complications. For 4-6 hours before the procedure -- stop eating and drinking. The contrast dye can cause nausea, but this is less likely if your stomach is empty. Ask your health care provider about changing or stopping your regular medicines. This is especially important if you are taking diabetes medicines, blood thinners, or medicines to treat problems with erections (erectile dysfunction). What happens during the procedure?  Hair on your chest may need to be removed so that small sticky patches called electrodes can be placed on your chest. These will transmit information that helps to monitor your heart during the procedure. An IV will be inserted into one of your  veins. You might be given a medicine to control your heart rate during the procedure. This will help to ensure that good images are obtained. You will be asked to lie on an exam table. This table will slide in and out of the CT machine during the procedure. Contrast dye will be injected into the IV. You might feel warm, or you may get a metallic taste in your mouth. You will be given a medicine called nitroglycerin. This will relax or dilate the arteries in your heart. The table that you are lying on will move into the CT machine tunnel for the scan. The person running the machine will give you instructions while the scans are being done. You may be asked to: Keep your arms above your head. Hold your breath. Stay very still, even if the table is moving. When the scanning is complete, you will be moved out of the machine. The IV will be removed. The procedure may vary among health care providers and hospitals. What can I expect after the procedure? After your procedure, it is common to have: A metallic taste in your mouth from the contrast dye. A feeling of warmth. A headache from the nitroglycerin. Follow these instructions at home: Take over-the-counter and prescription medicines only as told by your health care provider. If you are told, drink enough fluid to keep your urine pale yellow. This will help to flush the contrast dye out of your body. Most people can return to their normal activities right after the procedure. Ask your health care provider what activities are safe for you. It is up to you to get the results of your procedure. Ask your health care provider, or the department that is doing the procedure, when your results will be ready. Keep all follow-up visits as told by your health care provider. This is important. Contact a health care provider if: You have any symptoms of allergy to the contrast dye. These include: Shortness of breath. Rash or hives. A racing  heartbeat. Summary A cardiac CT angiogram is a procedure to look at the heart and the area around the heart. It may be done to help find the cause of chest pains or other symptoms of heart disease. During this procedure, a large X-ray machine, called a CT scanner, takes detailed pictures of the heart and the surrounding area after a contrast dye has been injected into blood vessels in the area. Ask your health care provider about changing or stopping your regular medicines before the procedure. This is especially important if you are taking diabetes medicines, blood thinners, or medicines to treat erectile dysfunction. If you are told, drink enough fluid to keep your urine pale yellow. This will help to flush the contrast dye out of your body. This information is not  intended to replace advice given to you by your health care provider. Make sure you discuss any questions you have with your health care provider. Document Revised: 03/22/2019 Document Reviewed: 03/22/2019 Elsevier Patient Education  [place].

## 2022-03-02 ENCOUNTER — Other Ambulatory Visit (HOSPITAL_COMMUNITY): Payer: BLUE CROSS/BLUE SHIELD

## 2022-03-05 ENCOUNTER — Telehealth: Payer: Self-pay | Admitting: Cardiology

## 2022-03-05 NOTE — Telephone Encounter (Signed)
Advised to call her insurance and ask for a list of in network facilities. Advised to call back with the following info and we will send her CT order.

## 2022-03-05 NOTE — Telephone Encounter (Signed)
Patient's CT scan was cancelled because we are out of network with her insurance.  She wants to know where she can have it done.

## 2022-03-11 NOTE — Telephone Encounter (Signed)
Patient called with the contact information for the facility that is in her network to perform the CT scan [facility], ph# [phone].  Patient requested order be sent over to them and she be given a call back to confirm.

## 2022-03-11 NOTE — Telephone Encounter (Signed)
[facility] is sending their order. I also sent a message to pre cert department and request to send status to [facility].

## 2022-03-13 NOTE — Telephone Encounter (Signed)
Patient aware [facility] does not provide the specific CT needed in our office. She will call us back with other facilities

## 2022-03-17 ENCOUNTER — Telehealth: Payer: Self-pay | Admitting: Cardiology

## 2022-03-17 NOTE — Telephone Encounter (Signed)
Patient is calling back regarding her previous conversation with Javanna. Patient states since she is unable to get the CT at [facility] she would need to get it at [facility] in [place]. She is requesting a callback once order has been sent so she is aware. Patient cancelled upcoming appt for this week due to wanting it after here CT is performed. Please advise.

## 2022-03-17 NOTE — Telephone Encounter (Signed)
Left a detailed message explaining order faxed to requested location in [facility].

## 2022-03-19 ENCOUNTER — Telehealth: Payer: Self-pay | Admitting: Cardiology

## 2022-03-19 NOTE — Telephone Encounter (Signed)
Pt returning call from CMA. Please advise 

## 2022-03-19 NOTE — Telephone Encounter (Signed)
New Message:     Patient says her insurance will pay for her CT at [facility] in [place].

## 2022-03-20 ENCOUNTER — Ambulatory Visit: Payer: BLUE CROSS/BLUE SHIELD | Admitting: Cardiology

## 2022-03-25 NOTE — Telephone Encounter (Signed)
-----   Message from Darrel Reach, [place] sent at 03/24/2022  5:46 PM EDT ----- Regarding: CT I received CT order status from [facility], they said CT cannot be performed at their facility. This make the 3rd location I have tried. Patient made efforts to fine a location where her CT can be done but this hs not been successful. Reuben Likes can you assist from here please. Thanks

## 2022-03-25 NOTE — Telephone Encounter (Signed)
Left VM for pt to call back.

## 2022-04-07 ENCOUNTER — Telehealth: Payer: Self-pay

## 2022-04-07 MED ORDER — PREDNISONE 50 MG PO TABS
ORAL_TABLET | ORAL | 0 refills | Status: DC
Start: 2022-04-07 — End: 2022-05-20

## 2022-04-07 NOTE — Addendum Note (Signed)
Addended by: Truddie Hidden on: 04/07/2022 08:29 AM   Modules accepted: Orders

## 2022-04-07 NOTE — Telephone Encounter (Signed)
-----   Message from Darrel Reach, [place] sent at 03/24/2022  5:46 PM EDT ----- Regarding: CT I received CT order status from [facility], they said CT cannot be performed at their facility. This make the 3rd location I have tried. Patient made efforts to fine a location where her CT can be done but this hs not been successful. Reuben Likes can you assist from here please. Thanks

## 2022-04-07 NOTE — Addendum Note (Signed)
Addended by: Truddie Hidden on: 04/07/2022 08:27 AM   Modules accepted: Orders

## 2022-04-07 NOTE — Telephone Encounter (Signed)
Pt aware that order has been sent to [place] at the hospital.

## 2022-05-14 DIAGNOSIS — R519 Headache, unspecified: Secondary | ICD-10-CM

## 2022-05-14 DIAGNOSIS — I10 Essential (primary) hypertension: Secondary | ICD-10-CM

## 2022-05-14 HISTORY — DX: Headache, unspecified: R51.9

## 2022-05-14 HISTORY — DX: Essential (primary) hypertension: I10

## 2022-05-20 ENCOUNTER — Other Ambulatory Visit: Payer: Self-pay

## 2022-05-25 ENCOUNTER — Encounter: Payer: Self-pay | Admitting: Cardiology

## 2022-05-25 ENCOUNTER — Ambulatory Visit: Payer: BLUE CROSS/BLUE SHIELD | Attending: Cardiology | Admitting: Cardiology

## 2022-05-25 VITALS — BP 128/70 | HR 72 | Ht 63.6 in | Wt 144.0 lb

## 2022-05-25 DIAGNOSIS — I700 Atherosclerosis of aorta: Secondary | ICD-10-CM

## 2022-05-25 DIAGNOSIS — I10 Essential (primary) hypertension: Secondary | ICD-10-CM | POA: Diagnosis not present

## 2022-05-25 DIAGNOSIS — F17210 Nicotine dependence, cigarettes, uncomplicated: Secondary | ICD-10-CM | POA: Diagnosis not present

## 2022-05-25 DIAGNOSIS — E782 Mixed hyperlipidemia: Secondary | ICD-10-CM | POA: Diagnosis not present

## 2022-05-25 NOTE — Patient Instructions (Signed)

## 2022-05-25 NOTE — Progress Notes (Signed)
Cardiology Office Note:    Date:  05/25/2022   ID:  Angela Fuller, DOB 1961/02/17, MRN [id number]  PCP:  Gweneth Fritter, FNP  Cardiologist:  Jenean Lindau, MD   Referring MD: Gweneth Fritter, FNP    ASSESSMENT:    1. Aortic atherosclerosis ([place])   2. Essential hypertension   3. Mixed hyperlipidemia    PLAN:    In order of problems listed above:  Primary prevention stressed with the patient.  Importance of compliance with diet medication stressed and she vocalized understanding. Aortic atherosclerosis I discussed this with the patient at extensive length and questions were answered to her satisfaction.  She was advised to walk at least half an hour a day 5 days a week and she promises to do so. Mixed dyslipidemia: I discussed diet.  Goal LDL must be less than 70 in view of aortic atherosclerosis and she states that she will follow with her primary care for these issues.  She will need lipid-lowering medications in my opinion but she wants to defer this decision for later. Cigarette smoker: I spent 5 minutes with the patient discussing solely about smoking. Smoking cessation was counseled. I suggested to the patient also different medications and pharmacological interventions. Patient is keen to try stopping on its own at this time. He will get back to me if he needs any further assistance in this matter. She will be seen in follow-up appointment on a as needed basis only.  She had multiple questions which were answered to her satisfaction.   Medication Adjustments/Labs and Tests Ordered: Current medicines are reviewed at length with the patient today.  Concerns regarding medicines are outlined above.  No orders of the defined types were placed in this encounter.  No orders of the defined types were placed in this encounter.    No chief complaint on file.    History of Present Illness:    Angela Fuller is a 61 y.o. female.  Patient has past medical history of aortic  atherosclerosis and unfortunately continues to smoke.  She has marked dyslipidemia.  She mentions to me that she has no chest pain now.  She started walking on a regular basis.  At the time of my evaluation, the patient is alert awake oriented and in no distress.  Past Medical History:  Diagnosis Date   Allergic rhinitis 09/15/2019   Anxiety disorder 01/21/2012   Aortic atherosclerosis ([place]) 02/05/2022   Asthma 01/21/2012   B12 deficiency 02/03/2021   Bronchitis 06/20/2015   Cardiomyopathy ([place]) 02/05/2022   Chest pain syndrome 11/06/2021   Chronic daily headache 05/14/2022   Cigarette smoker 02/05/2022   Depressive disorder 01/21/2012   Diverticulitis of large intestine without perforation or abscess without bleeding 12/26/2019   Encounter for screening colonoscopy 06/25/2020   Essential hypertension 05/14/2022   Gastroesophageal reflux disease with esophagitis without hemorrhage 06/03/2021   History of breast cancer 11/30/2019   Irritable bowel syndrome with diarrhea 11/30/2019   Malaise and fatigue 11/30/2019   Malignant neoplasm of breast (female) ([place]) 02/20/2008   Formatting of this note might be different from the original. 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  The lesion was found to; be estrogen and progesterone positive and HER-2/neu negative.  The; patient is status post radiation therapy and tamoxifen therapy was; initiat   Mild intermittent asthma without complication 5/46/5681   Mixed hyperlipidemia 06/03/2020  Personal history of nicotine dependence 12/02/2021   Personal history of other mental and behavioral disorders 01/21/2012   Screening examination for other arthropod-borne viral diseases 06/25/2020   Thoracic or lumbosacral neuritis or radiculitis 01/21/2012   Trochanteric bursitis of right hip 05/09/2012   Varicose veins of bilateral lower extremities  with pain 04/24/2021   Venous insufficiency (chronic) (peripheral) 10/02/2021   Viral upper respiratory tract infection 06/20/2015    Past Surgical History:  Procedure Laterality Date   APPENDECTOMY     VAGINAL HYSTERECTOMY      Current Medications: Current Meds  Medication Sig   Coenzyme Q10 (COQ10) 100 MG CAPS Take 100 mg by mouth daily.   MAGNESIUM PO Take 1 tablet by mouth daily.   metoprolol tartrate (LOPRESSOR) 25 MG tablet Take 25 mg by mouth every morning.   VITAMIN D-VITAMIN K PO Take 1 tablet by mouth daily.     Allergies:   Contrast media [iodinated contrast media]   Social History   Socioeconomic History   Marital status: Married    Spouse name: Not on file   Number of children: Not on file   Years of education: Not on file   Highest education level: Not on file  Occupational History   Not on file  Tobacco Use   Smoking status: Every Day    Types: Cigarettes   Smokeless tobacco: Never  Substance and Sexual Activity   Alcohol use: Not on file   Drug use: Not on file   Sexual activity: Not on file  Other Topics Concern   Not on file  Social History Narrative   Not on file   Social Determinants of Health   Financial Resource Strain: Not on file  Food Insecurity: Not on file  Transportation Needs: Not on file  Physical Activity: Not on file  Stress: Not on file  Social Connections: Not on file     Family History: The patient's family history includes Cancer in her sister and sister; Diabetes in her sister; Heart disease in her sister and sister; Hypertension in her mother.  ROS:   Please see the history of present illness.    All other systems reviewed and are negative.  EKGs/Labs/Other Studies Reviewed:    The following studies were reviewed today: EKG reveals sinus rhythm and nonspecific ST-T changes calcium score is 0 coronaries are fine on CT coronary angiography and patient has aortic atherosclerosis.   Recent Labs: No results found for  requested labs within last 365 days.  Recent Lipid Panel No results found for: "CHOL", "TRIG", "HDL", "CHOLHDL", "VLDL", "LDLCALC", "LDLDIRECT"  Physical Exam:    VS:  BP 128/70   Pulse 72   Ht 5' 3.6" (1.615 m)   Wt 144 lb (65.3 kg)   SpO2 95%   BMI 25.03 kg/m     Wt Readings from Last 3 Encounters:  05/25/22 144 lb (65.3 kg)  02/05/22 143 lb 6.4 oz (65 kg)  04/10/17 103 lb 8 oz (46.9 kg)     GEN: Patient is in no acute distress HEENT: Normal NECK: No JVD; No carotid bruits LYMPHATICS: No lymphadenopathy CARDIAC: Hear sounds regular, 2/6 systolic murmur at the apex. RESPIRATORY:  Clear to auscultation without rales, wheezing or rhonchi  ABDOMEN: Soft, non-tender, non-distended MUSCULOSKELETAL:  No edema; No deformity  SKIN: Warm and dry NEUROLOGIC:  Alert and oriented x 3 PSYCHIATRIC:  Normal affect   Signed, Jenean Lindau, MD  05/25/2022 2:48 PM    [facility]

## 2024-05-17 ENCOUNTER — Other Ambulatory Visit (HOSPITAL_BASED_OUTPATIENT_CLINIC_OR_DEPARTMENT_OTHER): Payer: Self-pay | Admitting: Family Medicine

## 2024-05-17 DIAGNOSIS — Z1231 Encounter for screening mammogram for malignant neoplasm of breast: Secondary | ICD-10-CM

## 2024-05-30 ENCOUNTER — Ambulatory Visit (HOSPITAL_BASED_OUTPATIENT_CLINIC_OR_DEPARTMENT_OTHER): Admission: EM | Admit: 2024-05-30 | Discharge: 2024-05-30 | Disposition: A | Discharge status: Home / Self Care

## 2024-05-30 ENCOUNTER — Encounter (HOSPITAL_BASED_OUTPATIENT_CLINIC_OR_DEPARTMENT_OTHER): Payer: Self-pay

## 2024-05-30 ENCOUNTER — Ambulatory Visit (HOSPITAL_BASED_OUTPATIENT_CLINIC_OR_DEPARTMENT_OTHER): Admit: 2024-05-30 | Discharge: 2024-05-30 | Disposition: A | Admitting: Radiology

## 2024-05-30 DIAGNOSIS — K5900 Constipation, unspecified: Secondary | ICD-10-CM

## 2024-05-30 DIAGNOSIS — R1010 Upper abdominal pain, unspecified: Secondary | ICD-10-CM | POA: Diagnosis not present

## 2024-05-30 NOTE — Discharge Instructions (Addendum)
 I believe the this constipation and increased gas is causing your abdominal pain. I recommend doing some MiraLAX you can do this twice a day until good bowel movement. You could also try doing some prune juice. The headache may just be from not getting good sleep.  If you have any continued issues please follow-up with your doctor

## 2024-05-30 NOTE — ED Provider Notes (Signed)
 [facility]    CSN: [id number] Arrival date & time: 05/30/24  0824      History   Chief Complaint Chief Complaint  Patient presents with   Abdominal Pain   Headache    HPI Angela Fuller is a 63 y.o. female.   Pt states last night she started to have R&LUQ pain and a HA. Pt states she has been having this pain off and on for 2 years. The only thing she has been told was that she had increased stool and prescribed metamucil. She has continued to take the metamucil daily but doesn't always see a difference. She states right now she has been having harder stools than normal. Rumbling in her stomach. Was prescribed medication for chronic constipation but unable to afford. She is unsure of the name. Did not sleep well last night due to the pain.     Abdominal Pain Headache Associated symptoms: abdominal pain     Past Medical History:  Diagnosis Date   Allergic rhinitis 09/15/2019   Anxiety disorder 01/21/2012   Aortic atherosclerosis 02/05/2022   Asthma 01/21/2012   B12 deficiency 02/03/2021   Bronchitis 06/20/2015   Cardiomyopathy (HCC) 02/05/2022   Chest pain syndrome 11/06/2021   Chronic daily headache 05/14/2022   Cigarette smoker 02/05/2022   Depressive disorder 01/21/2012   Diverticulitis of large intestine without perforation or abscess without bleeding 12/26/2019   Encounter for screening colonoscopy 06/25/2020   Essential hypertension 05/14/2022   Gastroesophageal reflux disease with esophagitis without hemorrhage 06/03/2021   History of breast cancer 11/30/2019   Irritable bowel syndrome with diarrhea 11/30/2019   Malaise and fatigue 11/30/2019   Malignant neoplasm of breast (female) (HCC) 02/20/2008   Formatting of this note might be different from the original. 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  The lesion was found to; be estrogen  and progesterone positive and HER-2/neu negative.  The; patient is status post radiation therapy and tamoxifen therapy was; initiat   Mild intermittent asthma without complication 01/21/2012   Mixed hyperlipidemia 06/03/2020   Personal history of nicotine dependence 12/02/2021   Personal history of other mental and behavioral disorders 01/21/2012   Screening examination for other arthropod-borne viral diseases 06/25/2020   Thoracic or lumbosacral neuritis or radiculitis 01/21/2012   Trochanteric bursitis of right hip 05/09/2012   Varicose veins of bilateral lower extremities with pain 04/24/2021   Venous insufficiency (chronic) (peripheral) 10/02/2021   Viral upper respiratory tract infection 06/20/2015    Patient Active Problem List   Diagnosis Date Noted   Chronic daily headache 05/14/2022   Essential hypertension 05/14/2022   Aortic atherosclerosis 02/05/2022   Cigarette smoker 02/05/2022   Cardiomyopathy (HCC) 02/05/2022   Personal history of nicotine dependence 12/02/2021   Chest pain syndrome 11/06/2021   Venous insufficiency (chronic) (peripheral) 10/02/2021   Gastroesophageal reflux disease with esophagitis without hemorrhage 06/03/2021   Varicose veins of bilateral lower extremities with pain 04/24/2021   B12 deficiency 02/03/2021   Encounter for screening colonoscopy 06/25/2020   Screening examination for other arthropod-borne viral diseases 06/25/2020   Mixed hyperlipidemia 06/03/2020   Diverticulitis of large intestine without perforation or abscess without bleeding 12/26/2019   History of breast cancer 11/30/2019   Irritable bowel syndrome with diarrhea 11/30/2019   Malaise and fatigue 11/30/2019   Allergic rhinitis 09/15/2019   Bronchitis 06/20/2015   Viral upper respiratory tract infection 06/20/2015  Trochanteric bursitis of right hip 05/09/2012   Anxiety disorder 01/21/2012   Asthma 01/21/2012   Depressive disorder 01/21/2012   Mild intermittent asthma without  complication 01/21/2012   Personal history of other mental and behavioral disorders 01/21/2012   Thoracic or lumbosacral neuritis or radiculitis 01/21/2012   Malignant neoplasm of breast (female) (HCC) 02/20/2008    Past Surgical History:  Procedure Laterality Date   APPENDECTOMY     VAGINAL HYSTERECTOMY      OB History   No obstetric history on file.      Home Medications    Prior to Admission medications   Medication Sig Start Date End Date Taking? Authorizing Provider  ascorbic acid (VITAMIN C) 500 MG tablet Take 500 mg by mouth.    [provider]  Coenzyme Q10 (COQ10) 100 MG CAPS Take 100 mg by mouth daily.    [provider]  metoprolol succinate (TOPROL-XL) 25 MG 24 hr tablet Take 25 mg by mouth daily.    [provider]  VITAMIN D-VITAMIN K PO Take 1 tablet by mouth daily.    [provider]    Family History Family History  Problem Relation Age of Onset   Hypertension Mother    Diabetes Sister    Cancer Sister    Heart disease Sister    Cancer Sister    Heart disease Sister     Social History Social History   Tobacco Use   Smoking status: Every Day    Types: Cigarettes   Smokeless tobacco: Never  Vaping Use   Vaping status: Never Used     Allergies   Ezetimibe and Contrast media [iodinated contrast media]   Review of Systems Review of Systems  Gastrointestinal:  Positive for abdominal pain.  Neurological:  Positive for headaches.     Physical Exam Triage Vital Signs ED Triage Vitals  Encounter Vitals Group     BP 05/30/24 0846 128/80     Girls Systolic BP Percentile --      Girls Diastolic BP Percentile --      Boys Systolic BP Percentile --      Boys Diastolic BP Percentile --      Pulse Rate 05/30/24 0846 87     Resp 05/30/24 0846 20     Temp 05/30/24 0846 98.2 F (36.8 C)     Temp Source 05/30/24 0846 Oral     SpO2 05/30/24 0846 95 %     Weight --      Height --      Head Circumference --       Peak Flow --      Pain Score 05/30/24 0843 6     Pain Loc --      Pain Education --      Exclude from Growth Chart --    No data found.  Updated Vital Signs BP 128/80 (BP Location: Right Arm)   Pulse 87   Temp 98.2 F (36.8 C) (Oral)   Resp 20   SpO2 95%   Visual Acuity Right Eye Distance:   Left Eye Distance:   Bilateral Distance:    Right Eye Near:   Left Eye Near:    Bilateral Near:     Physical Exam Vitals and nursing note reviewed.  Constitutional:      General: She is not in acute distress.    Appearance: Normal appearance. She is not ill-appearing, toxic-appearing or diaphoretic.  Eyes:     Conjunctiva/sclera: Conjunctivae normal.  Pulmonary:  Effort: Pulmonary effort is normal.  Abdominal:     Tenderness: There is generalized abdominal tenderness and tenderness in the right upper quadrant and left upper quadrant.  Musculoskeletal:        General: Normal range of motion.  Skin:    General: Skin is warm and dry.  Neurological:     General: No focal deficit present.     Mental Status: She is alert.  Psychiatric:        Mood and Affect: Mood normal.      UC Treatments / Results  Labs (all labs ordered are listed, but only abnormal results are displayed) Labs Reviewed - No data to display  EKG   Radiology DG Abdomen 1 View Result Date: 05/30/2024 EXAM: 1 VIEW XRAY OF THE ABDOMEN 05/30/2024 09:05:28 AM COMPARISON: CT abdomen and pelvis 10/19/2023. CLINICAL HISTORY: 63 year old female with chronic abdominal pain, R\T\LUQ pain, headache, and changes in stool consistency. FINDINGS: BOWEL: Nonobstructive bowel gas pattern. Mild to moderate volume of retained stool in the distal descending and rectosigmoid colon appears decreased from the comparison CT abdomen and pelvis. SOFT TISSUES: No opaque urinary calculi. BONES: No acute osseous abnormality. IMPRESSION: 1. No acute abnormality identified in the abdomen. 2. Decreased stool burden in the distal  descending and rectosigmoid colon. Electronically signed by: Helayne Hurst MD 05/30/2024 09:27 AM EDT RP Workstation: [id number]    Procedures Procedures (including critical care time)  Medications Ordered in UC Medications - No data to display  Initial Impression / Assessment and Plan / UC Course  I have reviewed the triage vital signs and the nursing notes.  Pertinent labs & imaging results that were available during my care of the patient were reviewed by me and considered in my medical decision making (see chart for details).     Upper abdominal pain with constipation.  X-ray revealed mild to moderate volume of retained stool.  Compared to CT scan done earlier in the year.  She is not having any concerning signs or symptoms.  Her vital signs are stable.  Recommended to start drinking prune juice daily she can do MiraLAX once or twice a day until good bowel movement. She is already eating a healthy diet and drinking fluids Headache most likely due to her not sleeping well last night.  This seems resolved with Tylenol. Recommend follow-up with her doctor for these chronic issues Final Clinical Impressions(s) / UC Diagnoses   Final diagnoses:  Constipation, unspecified constipation type  Pain of upper abdomen     Discharge Instructions      I believe the this constipation and increased gas is causing your abdominal pain. I recommend doing some MiraLAX you can do this twice a day until good bowel movement. You could also try doing some prune juice. The headache may just be from not getting good sleep.  If you have any continued issues please follow-up with your doctor     ED Prescriptions   None    PDMP not reviewed this encounter.   Adah Wilbert LABOR, FNP 05/30/24 [phone]

## 2024-05-30 NOTE — ED Triage Notes (Signed)
 Pt states last night she started to have R&LUQ pain and a HA. Pt states she has been having this pain off and on for 2 years. The only thing she has been told was that she had increased stool and prescribed metamucil. She has continued to take the metamucil daily but doesn't always see a difference. She states right now she has been having harder stools than normal.   She started to have a HA last night. HA is located the right and left top of her head  and is described as an ache. She has taken a tylenol with some relief.

## 2024-05-31 ENCOUNTER — Encounter (HOSPITAL_BASED_OUTPATIENT_CLINIC_OR_DEPARTMENT_OTHER): Payer: Self-pay | Admitting: Radiology

## 2024-05-31 ENCOUNTER — Inpatient Hospital Stay (HOSPITAL_BASED_OUTPATIENT_CLINIC_OR_DEPARTMENT_OTHER)
Admission: RE | Admit: 2024-05-31 | Discharge: 2024-05-31 | Disposition: A | Source: Ambulatory Visit | Attending: Family Medicine | Admitting: Family Medicine

## 2024-05-31 DIAGNOSIS — Z1231 Encounter for screening mammogram for malignant neoplasm of breast: Secondary | ICD-10-CM | POA: Diagnosis not present

## 2024-06-15 ENCOUNTER — Ambulatory Visit (HOSPITAL_BASED_OUTPATIENT_CLINIC_OR_DEPARTMENT_OTHER)
Admission: EM | Admit: 2024-06-15 | Discharge: 2024-06-15 | Disposition: A | Discharge status: Home / Self Care | Attending: Family Medicine | Admitting: Family Medicine

## 2024-06-15 ENCOUNTER — Encounter (HOSPITAL_BASED_OUTPATIENT_CLINIC_OR_DEPARTMENT_OTHER): Payer: Self-pay

## 2024-06-15 DIAGNOSIS — R079 Chest pain, unspecified: Secondary | ICD-10-CM | POA: Diagnosis not present

## 2024-06-15 DIAGNOSIS — R1024 Suprapubic pain: Secondary | ICD-10-CM | POA: Diagnosis not present

## 2024-06-15 DIAGNOSIS — M79632 Pain in left forearm: Secondary | ICD-10-CM

## 2024-06-15 DIAGNOSIS — M25512 Pain in left shoulder: Secondary | ICD-10-CM | POA: Diagnosis not present

## 2024-06-15 DIAGNOSIS — M79622 Pain in left upper arm: Secondary | ICD-10-CM

## 2024-06-15 DIAGNOSIS — M545 Low back pain, unspecified: Secondary | ICD-10-CM

## 2024-06-15 LAB — POCT URINE DIPSTICK
Bilirubin, UA: NEGATIVE
Blood, UA: NEGATIVE
Glucose, UA: NEGATIVE mg/dL
Ketones, POC UA: NEGATIVE mg/dL
Leukocytes, UA: NEGATIVE
Nitrite, UA: NEGATIVE
Protein Ur, POC: NEGATIVE mg/dL
Spec Grav, UA: 1.01 (ref 1.010–1.025)
Urobilinogen, UA: 0.2 U/dL
pH, UA: 6 (ref 5.0–8.0)

## 2024-06-15 MED ORDER — CYCLOBENZAPRINE HCL 5 MG PO TABS
5.0000 mg | ORAL_TABLET | Freq: Every evening | ORAL | 0 refills | Status: AC | PRN
Start: 2024-06-15 — End: 2024-06-30

## 2024-06-15 MED ORDER — DICLOFENAC SODIUM 50 MG PO TBEC: 50.0000 mg | DELAYED_RELEASE_TABLET | Freq: Two times a day (BID) | ORAL | 0 refills | Status: AC | PRN

## 2024-06-15 NOTE — ED Triage Notes (Signed)
 Pt c/o lower back pain, left arm pain x 4 days. Pain is worsen during the night. Pt reports taken Asprin and tylenol.

## 2024-06-15 NOTE — ED Provider Notes (Signed)
 Angela Fuller    CSN: [id number] Arrival date & time: 06/15/24  1315      History   Chief Complaint Chief Complaint  Patient presents with   Back Pain   Arm Pain    HPI Angela Fuller is a 63 y.o. female.   63 year old female with report of lower back pain, left shoulder, left upper arm, left forearm pain.  Symptoms started on approximately 06/11/24.  Patient has had some minimal lower abdominal pain.  She has had some left flank pain.  She also has some left rib or chest wall pain.  She has a cardiac history.  She saw cardiologist she did not know and got a big workup and was told that her ejection fraction was low.  She was started on metoprolol for her heart.  That has led to a lot of constipation and fatigue and other symptoms.  She got a second opinion with Dr. Raylene with [facility] cardiology.  He repeated most of the test.  She apparently had something viral at the time of the first cardiac workup.  With the second workup she was feeling great and her EF was 55%.  Dr. Raylene said that basically she was doing well and he wanted to see her in January 2026.  He encouraged her to stay on the metoprolol for now but that they could talk about weaning it off in January if she was continuing to do well.  She is concerned that her chest pain and left arm pain could be cardiac in nature.  She has not had any injury nor trauma to her chest or left arm.  She has been doing a lot of work on her home as they are doing some renovations.  She does feel like she is doing a lot of scrubbing and work with her left arm.  She denies burning, frequency, urgency, hematuria.  She denies fever, chills, nausea, vomiting, diarrhea.  She does have intermittent constipation.   Back Pain Associated symptoms: abdominal pain (Mild suprapubic abdominal pain.) and chest pain (Left anterior chest wall pain)   Associated symptoms: no dysuria and no fever   Arm Pain Associated symptoms include chest pain  (Left anterior chest wall pain) and abdominal pain (Mild suprapubic abdominal pain.). Pertinent negatives include no shortness of breath.    Past Medical History:  Diagnosis Date   Allergic rhinitis 09/15/2019   Anxiety disorder 01/21/2012   Aortic atherosclerosis 02/05/2022   Asthma 01/21/2012   B12 deficiency 02/03/2021   Bronchitis 06/20/2015   Cardiomyopathy (HCC) 02/05/2022   Chest pain syndrome 11/06/2021   Chronic daily headache 05/14/2022   Cigarette smoker 02/05/2022   Depressive disorder 01/21/2012   Diverticulitis of large intestine without perforation or abscess without bleeding 12/26/2019   Encounter for screening colonoscopy 06/25/2020   Essential hypertension 05/14/2022   Gastroesophageal reflux disease with esophagitis without hemorrhage 06/03/2021   History of breast cancer 11/30/2019   Irritable bowel syndrome with diarrhea 11/30/2019   Malaise and fatigue 11/30/2019   Malignant neoplasm of breast (female) (HCC) 02/20/2008   Formatting of this note might be different from the original. 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  The lesion was found to; be estrogen and progesterone positive and HER-2/neu negative.  The; patient is status post radiation therapy and tamoxifen therapy was; initiat   Mild intermittent asthma without complication 01/21/2012  Mixed hyperlipidemia 06/03/2020   Personal history of nicotine dependence 12/02/2021   Personal history of other mental and behavioral disorders 01/21/2012   Screening examination for other arthropod-borne viral diseases 06/25/2020   Thoracic or lumbosacral neuritis or radiculitis 01/21/2012   Trochanteric bursitis of right hip 05/09/2012   Varicose veins of bilateral lower extremities with pain 04/24/2021   Venous insufficiency (chronic) (peripheral) 10/02/2021   Viral upper respiratory tract infection 06/20/2015     Patient Active Problem List   Diagnosis Date Noted   Chronic daily headache 05/14/2022   Essential hypertension 05/14/2022   Aortic atherosclerosis 02/05/2022   Cigarette smoker 02/05/2022   Cardiomyopathy (HCC) 02/05/2022   Personal history of nicotine dependence 12/02/2021   Chest pain syndrome 11/06/2021   Venous insufficiency (chronic) (peripheral) 10/02/2021   Gastroesophageal reflux disease with esophagitis without hemorrhage 06/03/2021   Varicose veins of bilateral lower extremities with pain 04/24/2021   B12 deficiency 02/03/2021   Encounter for screening colonoscopy 06/25/2020   Screening examination for other arthropod-borne viral diseases 06/25/2020   Mixed hyperlipidemia 06/03/2020   Diverticulitis of large intestine without perforation or abscess without bleeding 12/26/2019   History of breast cancer 11/30/2019   Irritable bowel syndrome with diarrhea 11/30/2019   Malaise and fatigue 11/30/2019   Allergic rhinitis 09/15/2019   Bronchitis 06/20/2015   Viral upper respiratory tract infection 06/20/2015   Trochanteric bursitis of right hip 05/09/2012   Anxiety disorder 01/21/2012   Asthma 01/21/2012   Depressive disorder 01/21/2012   Mild intermittent asthma without complication 01/21/2012   Personal history of other mental and behavioral disorders 01/21/2012   Thoracic or lumbosacral neuritis or radiculitis 01/21/2012   Malignant neoplasm of breast (female) (HCC) 02/20/2008    Past Surgical History:  Procedure Laterality Date   APPENDECTOMY     VAGINAL HYSTERECTOMY      OB History   No obstetric history on file.      Home Medications    Prior to Admission medications   Medication Sig Start Date End Date Taking? Authorizing Provider  cyclobenzaprine (FLEXERIL) 5 MG tablet Take 1 tablet (5 mg total) by mouth at bedtime as needed for up to 15 days for muscle spasms. 06/15/24 06/30/24 Yes Ival Domino, FNP  diclofenac (VOLTAREN) 50 MG EC tablet Take 1  tablet (50 mg total) by mouth 2 (two) times daily as needed for up to 15 days (take with food). 06/15/24 06/30/24 Yes Ival Domino, FNP  ascorbic acid (VITAMIN C) 500 MG tablet Take 500 mg by mouth.    [provider]  Coenzyme Q10 (COQ10) 100 MG CAPS Take 100 mg by mouth daily.    [provider]  metoprolol succinate (TOPROL-XL) 25 MG 24 hr tablet Take 25 mg by mouth daily.    [provider]  VITAMIN D-VITAMIN K PO Take 1 tablet by mouth daily.    [provider]    Family History Family History  Problem Relation Age of Onset   Hypertension Mother    Diabetes Sister    Cancer Sister    Heart disease Sister    Cancer Sister    Heart disease Sister     Social History Social History   Tobacco Use   Smoking status: Every Day    Types: Cigarettes   Smokeless tobacco: Never  Vaping Use   Vaping status: Never Used     Allergies   Ezetimibe and Contrast media [iodinated contrast media]   Review of Systems Review of Systems  Constitutional:  Negative for chills and fever.  HENT:  Negative for ear pain and sore throat.   Eyes:  Negative for pain and visual disturbance.  Respiratory:  Negative for cough and shortness of breath.   Cardiovascular:  Positive for chest pain (Left anterior chest wall pain). Negative for palpitations.  Gastrointestinal:  Positive for abdominal pain (Mild suprapubic abdominal pain.). Negative for constipation, diarrhea, nausea and vomiting.  Genitourinary:  Negative for dysuria and hematuria.  Musculoskeletal:  Positive for arthralgias (Left shoulder, left upper arm, left forearm pain.) and back pain (Bilateral lower back pain).  Skin:  Negative for color change and rash.  Neurological:  Negative for seizures and syncope.  All other systems reviewed and are negative.    Physical Exam Triage Vital Signs ED Triage Vitals  Encounter Vitals Group     BP 06/15/24 1329 134/78     Girls Systolic BP Percentile --       Girls Diastolic BP Percentile --      Boys Systolic BP Percentile --      Boys Diastolic BP Percentile --      Pulse Rate 06/15/24 1329 84     Resp 06/15/24 1329 18     Temp 06/15/24 1329 98.4 F (36.9 C)     Temp Source 06/15/24 1329 Oral     SpO2 06/15/24 1329 96 %     Weight --      Height --      Head Circumference --      Peak Flow --      Pain Score 06/15/24 1327 8     Pain Loc --      Pain Education --      Exclude from Growth Chart --    No data found.  Updated Vital Signs BP 134/78 (BP Location: Left Arm)   Pulse 84   Temp 98.4 F (36.9 C) (Oral)   Resp 18   SpO2 96%   Visual Acuity Right Eye Distance:   Left Eye Distance:   Bilateral Distance:    Right Eye Near:   Left Eye Near:    Bilateral Near:     Physical Exam Vitals and nursing note reviewed.  Constitutional:      General: She is not in acute distress.    Appearance: She is well-developed. She is not ill-appearing, toxic-appearing or diaphoretic.  HENT:     Head: Normocephalic and atraumatic.     Right Ear: Hearing, tympanic membrane, ear canal and external ear normal.     Left Ear: Hearing, tympanic membrane, ear canal and external ear normal.     Nose: No congestion or rhinorrhea.     Right Sinus: No maxillary sinus tenderness or frontal sinus tenderness.     Left Sinus: No maxillary sinus tenderness or frontal sinus tenderness.     Mouth/Throat:     Lips: Pink.     Mouth: Mucous membranes are moist.     Pharynx: Uvula midline. No oropharyngeal exudate or posterior oropharyngeal erythema.     Tonsils: No tonsillar exudate.  Eyes:     Conjunctiva/sclera: Conjunctivae normal.     Pupils: Pupils are equal, round, and reactive to light.  Cardiovascular:     Rate and Rhythm: Normal rate and regular rhythm.     Pulses:          Dorsalis pedis pulses are 2+ on the right side and 2+ on the left side.       Posterior tibial pulses are 2+ on  the right side and 2+ on the left side.     Heart  sounds: S1 normal and S2 normal. No murmur heard. Pulmonary:     Effort: Pulmonary effort is normal. No respiratory distress.     Breath sounds: Normal breath sounds. No decreased breath sounds, wheezing, rhonchi or rales.  Abdominal:     General: Bowel sounds are normal.     Palpations: Abdomen is soft.     Tenderness: There is abdominal tenderness (Mild) in the suprapubic area. There is no right CVA tenderness, left CVA tenderness, guarding or rebound. Negative signs include Murphy's sign, Rovsing's sign and McBurney's sign.  Musculoskeletal:        General: No swelling.     Right shoulder: Normal.     Left shoulder: Tenderness (Left anterior and posterior shoulder pain) present. No swelling, deformity, effusion, laceration, bony tenderness or crepitus. Normal range of motion. Normal strength. Normal pulse.     Right upper arm: Normal.     Left upper arm: Tenderness (Mostly tenderness of the deltoid but some generalized soft tissue upper arm pain.) present. No swelling, edema, deformity, lacerations or bony tenderness.     Right elbow: Normal.     Left elbow: Normal.     Right forearm: Normal.     Left forearm: Tenderness (Minimal tenderness of the muscles of the forearm.) present. No swelling, edema, deformity, lacerations or bony tenderness.     Right wrist: Normal.     Left wrist: Normal.     Right hand: Normal.     Left hand: Normal.     Cervical back: Normal and neck supple.     Thoracic back: Normal.     Lumbar back: Tenderness and bony tenderness present. No swelling, edema, deformity, signs of trauma, lacerations or spasms. Normal range of motion. No scoliosis.     Right hip: Normal.     Left hip: Normal.     Right upper leg: Normal.     Left upper leg: Normal.     Right knee: Normal.     Left knee: Normal.     Right lower leg: Normal.     Left lower leg: Normal.     Right ankle: Normal.     Left ankle: Normal.     Right foot: Normal.     Left foot: Normal.      Comments: No back pain with straight leg lifts nor with full rotation of the legs through the range of motion of the hip.  Lymphadenopathy:     Head:     Right side of head: No submental, submandibular, tonsillar, preauricular or posterior auricular adenopathy.     Left side of head: No submental, submandibular, tonsillar, preauricular or posterior auricular adenopathy.     Cervical: No cervical adenopathy.     Right cervical: No superficial cervical adenopathy.    Left cervical: No superficial cervical adenopathy.  Skin:    General: Skin is warm and dry.     Capillary Refill: Capillary refill takes less than 2 seconds.     Findings: No rash.  Neurological:     Mental Status: She is alert and oriented to person, place, and time.  Psychiatric:        Mood and Affect: Mood normal.      UC Treatments / Results  Labs (all labs ordered are listed, but only abnormal results are displayed) Labs Reviewed  POCT URINE DIPSTICK - Normal  Comprehensive Metabolic Panel: 11/18/23:  Order: [id number] Component  Ref Range & Units 7 mo ago  Sodium 136 - 145 mmol/L 140  Potassium 3.5 - 5.1 mmol/L 4.0  Comment: NO VISIBLE HEMOLYSIS  Chloride 98 - 107 mmol/L 102  CO2 21 - 31 mmol/L 29  Anion Gap 6 - 14 mmol/L 9  Glucose, Random 70 - 99 mg/dL 99  Blood Urea Nitrogen (BUN) 7 - 25 mg/dL 14  Creatinine 9.39 - 8.79 mg/dL 9.12  eGFR >40 fO/fpw/8.26f7 76  Comment: GFR estimated by CKD-EPI equations(NKF 2021).  Recommend confirmation of Cr-based eGFR by using Cys-based eGFR and other filtration markers (if applicable) in complex cases and clinical decision-making, as needed.  Albumin 3.5 - 5.7 g/dL 4.8  Total Protein 6.4 - 8.9 g/dL 7.3  Bilirubin, Total 0.3 - 1.0 mg/dL 0.5  Alkaline Phosphatase (ALP) 34 - 104 U/L 71  Aspartate Aminotransferase (AST) 13 - 39 U/L 23  Alanine Aminotransferase (ALT) 7 - 52 U/L 17  Calcium 8.6 - 10.3 mg/dL 9.6  BUN/Creatinine Ratio   Comment:  Creatinine is normal, ratio is not clinically indicated.  Resulting Agency [facility](CLIA# [id number])    EKG   Radiology No results found.  Procedures ED EKG  Date/Time: 06/15/2024 2:32 PM  Performed by: Ival Domino, FNP Authorized by: Ival Domino, FNP   ECG interpreted by ED Physician in the absence of a cardiologist: yes MARYLN. Edyth Glomb, FNP-BC)   Previous ECG:    Previous ECG:  Unavailable Interpretation:    Interpretation: normal     Details:  NSR with Left Axis Deviation Rate:    ECG rate:  79   ECG rate assessment: normal   Rhythm:    Rhythm: sinus rhythm   Ectopy:    Ectopy: none   QRS:    QRS axis:  Left   QRS intervals:  Normal   QRS conduction: normal   ST segments:    ST segments:  Normal T waves:    T waves: non-specific   Q waves:    Abnormal Q-waves: not present    (including critical Fuller time)  Medications Ordered in UC Medications - No data to display  Initial Impression / Assessment and Plan / UC Course  I have reviewed the triage vital signs and the nursing notes.  Pertinent labs & imaging results that were available during my Fuller of the patient were reviewed by me and considered in my medical decision making (see chart for details).  Plan of Fuller: Chest wall pain/rib pain left shoulder, left arm, lower back pain, suprapubic abdominal pain: Urine is normal.  No sign of kidney stones.  ECG is normal.  No cardiac abnormalities noted on the EKG.  Will treat for muscular discomfort.  Cyclobenzaprine, 5 mg, 1 pill nightly for muscle spasm.  Do not use and drive.  Diclofenac, 50 mg, 1 pill every 12 hours as needed for muscular pain. Take the diclofenac with food.  May take both the diclofenac and cyclobenzaprine at night.  Take diclofenac and drive.  Follow-up if symptoms do not improve, worsen or new symptoms occur.  Follow-up with cardiology in January as planned or sooner if needed.  I reviewed the plan of Fuller  with the patient and/or the patient's guardian.  The patient and/or guardian had time to ask questions and acknowledged that the questions were answered.  I provided instruction on symptoms or reasons to return here or to go to an ER, if symptoms/condition did not improve, worsened or if new symptoms occurred.  Final Clinical Impressions(s) / UC Diagnoses   Final diagnoses:  Chest pain, unspecified type  Acute pain of left shoulder  Suprapubic pain  Acute midline low back pain without sciatica  Left upper arm pain  Left forearm pain     Discharge Instructions      Chest wall pain/rib pain left shoulder, left arm, lower back pain, suprapubic abdominal pain: Urine is normal.  No sign of kidney stones.  ECG is normal.  No cardiac abnormalities noted on the EKG.  Will treat for muscular discomfort.  Cyclobenzaprine, 5 mg, 1 pill nightly for muscle spasm.  Do not use and drive.  Diclofenac, 50 mg, 1 pill every 12 hours as needed for muscular pain. Take the diclofenac with food.  May take both the diclofenac and cyclobenzaprine at night.  Take diclofenac and drive.  Follow-up if symptoms do not improve, worsen or new symptoms occur.  Follow-up with cardiology in January as planned or sooner if needed.     ED Prescriptions     Medication Sig Dispense Auth. Provider   cyclobenzaprine (FLEXERIL) 5 MG tablet Take 1 tablet (5 mg total) by mouth at bedtime as needed for up to 15 days for muscle spasms. 15 tablet Altha Sweitzer, FNP   diclofenac (VOLTAREN) 50 MG EC tablet Take 1 tablet (50 mg total) by mouth 2 (two) times daily as needed for up to 15 days (take with food). 30 tablet Leandrew Keech, FNP      PDMP not reviewed this encounter.   Ival Domino, FNP 06/15/24 1504

## 2024-06-15 NOTE — Discharge Instructions (Signed)
 Chest wall pain/rib pain left shoulder, left arm, lower back pain, suprapubic abdominal pain: Urine is normal.  No sign of kidney stones.  ECG is normal.  No cardiac abnormalities noted on the EKG.  Will treat for muscular discomfort.  Cyclobenzaprine, 5 mg, 1 pill nightly for muscle spasm.  Do not use and drive.  Diclofenac, 50 mg, 1 pill every 12 hours as needed for muscular pain. Take the diclofenac with food.  May take both the diclofenac and cyclobenzaprine at night.  Take diclofenac and drive.  Follow-up if symptoms do not improve, worsen or new symptoms occur.  Follow-up with cardiology in January as planned or sooner if needed.
